# Patient Record
Sex: Male | Born: 1990 | Race: White | Hispanic: No | Marital: Single | Smoking: Current every day smoker
Health system: Southern US, Community
[De-identification: ages and names within clinical notes are randomized; demographics above are authoritative.]

## PROBLEM LIST (undated history)

## (undated) DIAGNOSIS — I10 Essential (primary) hypertension: Secondary | ICD-10-CM

## (undated) DIAGNOSIS — K219 Gastro-esophageal reflux disease without esophagitis: Secondary | ICD-10-CM

## (undated) DIAGNOSIS — F988 Other specified behavioral and emotional disorders with onset usually occurring in childhood and adolescence: Secondary | ICD-10-CM

## (undated) DIAGNOSIS — J45909 Unspecified asthma, uncomplicated: Secondary | ICD-10-CM

## (undated) HISTORY — DX: Other specified behavioral and emotional disorders with onset usually occurring in childhood and adolescence: F98.8

## (undated) HISTORY — DX: Gastro-esophageal reflux disease without esophagitis: K21.9

## (undated) HISTORY — DX: Essential (primary) hypertension: I10

---
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
## 2011-11-21 ENCOUNTER — Emergency Department (HOSPITAL_COMMUNITY)
Admission: EM | Admit: 2011-11-21 | Discharge: 2011-11-21 | Discharge status: Against Medical Advice | Payer: No Typology Code available for payment source | Attending: Emergency Medicine | Admitting: Emergency Medicine

## 2011-11-21 ENCOUNTER — Encounter (HOSPITAL_COMMUNITY): Payer: Self-pay | Admitting: Emergency Medicine

## 2011-11-21 DIAGNOSIS — R079 Chest pain, unspecified: Secondary | ICD-10-CM | POA: Insufficient documentation

## 2011-11-21 HISTORY — DX: Unspecified asthma, uncomplicated: J45.909

## 2011-11-21 NOTE — ED Notes (Signed)
Pt was found in the hallway outside of his room with pt's mother and father asking for the way out.  Pt stated he is refusing treatment and wanted to leave.  I asked if he wanted to sign to be discharged and pt stated, "No".

## 2011-11-21 NOTE — ED Notes (Signed)
Pt stated he needed to use the bathroom - Nurse provided urinal to pt and he asked for privacy.  When nurse entered the room - pt was found standing up from unfastening himself off of the long spine board attempting to use the urinal.  Pt screamed, "I can't piss laying down."  I instructed pt to lay back down and explained to him that he could cause further injury to himself by standing.  Pt refused and threw the urinal on the bed and attempted to close the door while nurse was standing in the doorway.  I told him not to shut the door and if he continued to be non-compliant, security would be called.
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
## 2014-11-22 ENCOUNTER — Emergency Department: Payer: Self-pay

## 2014-11-22 ENCOUNTER — Emergency Department
Admission: EM | Admit: 2014-11-22 | Discharge: 2014-11-22 | Disposition: A | Discharge status: Home / Self Care | Payer: Self-pay | Attending: Emergency Medicine | Admitting: Emergency Medicine

## 2014-11-22 DIAGNOSIS — Y9289 Other specified places as the place of occurrence of the external cause: Secondary | ICD-10-CM | POA: Insufficient documentation

## 2014-11-22 DIAGNOSIS — Y9389 Activity, other specified: Secondary | ICD-10-CM | POA: Insufficient documentation

## 2014-11-22 DIAGNOSIS — Z23 Encounter for immunization: Secondary | ICD-10-CM | POA: Insufficient documentation

## 2014-11-22 DIAGNOSIS — Z720 Tobacco use: Secondary | ICD-10-CM | POA: Insufficient documentation

## 2014-11-22 DIAGNOSIS — Y998 Other external cause status: Secondary | ICD-10-CM | POA: Insufficient documentation

## 2014-11-22 DIAGNOSIS — S91312A Laceration without foreign body, left foot, initial encounter: Secondary | ICD-10-CM | POA: Insufficient documentation

## 2014-11-22 DIAGNOSIS — IMO0002 Reserved for concepts with insufficient information to code with codable children: Secondary | ICD-10-CM

## 2014-11-22 DIAGNOSIS — W228XXA Striking against or struck by other objects, initial encounter: Secondary | ICD-10-CM | POA: Insufficient documentation

## 2014-11-22 MED ORDER — TETANUS-DIPHTH-ACELL PERTUSSIS 5-2.5-18.5 LF-MCG/0.5 IM SUSP
0.5000 mL | Freq: Once | INTRAMUSCULAR | Status: AC
  Administered 2014-11-22: 0.5 mL via INTRAMUSCULAR

## 2014-11-22 MED ORDER — TETANUS-DIPHTH-ACELL PERTUSSIS 5-2.5-18.5 LF-MCG/0.5 IM SUSP
INTRAMUSCULAR | Status: AC
  Filled 2014-11-22: qty 0.5

## 2014-11-22 MED ORDER — CEPHALEXIN 500 MG PO CAPS
500.0000 mg | ORAL_CAPSULE | Freq: Once | ORAL | Status: AC
  Administered 2014-11-22: 500 mg via ORAL

## 2014-11-22 MED ORDER — CEPHALEXIN 500 MG PO CAPS
ORAL_CAPSULE | ORAL | Status: AC
  Filled 2014-11-22: qty 1

## 2014-11-22 MED ORDER — CEPHALEXIN 500 MG PO CAPS: 500.0000 mg | ORAL_CAPSULE | Freq: Two times a day (BID) | ORAL | Status: DC

## 2014-11-22 MED ORDER — IBUPROFEN 800 MG PO TABS: 800.0000 mg | ORAL_TABLET | Freq: Three times a day (TID) | ORAL | Status: DC | PRN

## 2014-11-22 MED ORDER — HYDROCODONE-ACETAMINOPHEN 5-325 MG PO TABS
ORAL_TABLET | ORAL | Status: AC
  Filled 2014-11-22: qty 1

## 2014-11-22 MED ORDER — BACITRACIN ZINC 500 UNIT/GM EX OINT
TOPICAL_OINTMENT | CUTANEOUS | Status: AC
  Administered 2014-11-22: 23:00:00
  Filled 2014-11-22: qty 0.9

## 2014-11-22 MED ORDER — HYDROCODONE-ACETAMINOPHEN 5-325 MG PO TABS
1.0000 | ORAL_TABLET | Freq: Once | ORAL | Status: AC
  Administered 2014-11-22: 1 via ORAL

## 2014-11-22 NOTE — Discharge Instructions (Signed)
Clean the wound 2 times per day and apply antibiotic ointment. Use the crutches until pain decreases and there is no bleeding. Return to the ER for symptoms of concern if you are unable to see your primary care provider.
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
## 2015-05-23 ENCOUNTER — Encounter: Payer: Self-pay | Admitting: Emergency Medicine

## 2015-05-23 ENCOUNTER — Emergency Department
Admission: EM | Admit: 2015-05-23 | Discharge: 2015-05-23 | Disposition: A | Discharge status: Home / Self Care | Payer: Self-pay | Attending: Emergency Medicine | Admitting: Emergency Medicine

## 2015-05-23 DIAGNOSIS — F17210 Nicotine dependence, cigarettes, uncomplicated: Secondary | ICD-10-CM | POA: Insufficient documentation

## 2015-05-23 DIAGNOSIS — Z792 Long term (current) use of antibiotics: Secondary | ICD-10-CM | POA: Insufficient documentation

## 2015-05-23 DIAGNOSIS — Z202 Contact with and (suspected) exposure to infections with a predominantly sexual mode of transmission: Secondary | ICD-10-CM | POA: Insufficient documentation

## 2015-05-23 LAB — CHLAMYDIA/NGC RT PCR (ARMC ONLY)
Chlamydia Tr: DETECTED — AB
N gonorrhoeae: NOT DETECTED

## 2015-05-23 MED ORDER — AZITHROMYCIN 250 MG PO TABS
1000.0000 mg | ORAL_TABLET | Freq: Once | ORAL | Status: AC
  Administered 2015-05-23: 1000 mg via ORAL
  Filled 2015-05-23: qty 4

## 2015-05-23 MED ORDER — METRONIDAZOLE 500 MG PO TABS: 2000.0000 mg | ORAL_TABLET | Freq: Two times a day (BID) | ORAL | Status: AC

## 2015-05-23 MED ORDER — CEFTRIAXONE SODIUM 250 MG IJ SOLR
250.0000 mg | Freq: Once | INTRAMUSCULAR | Status: AC
  Administered 2015-05-23: 250 mg via INTRAMUSCULAR
  Filled 2015-05-23: qty 250

## 2015-05-23 MED ORDER — LIDOCAINE HCL (PF) 1 % IJ SOLN
5.0000 mL | Freq: Once | INTRAMUSCULAR | Status: AC
  Administered 2015-05-23: 0.9 mL

## 2015-05-23 MED ORDER — LIDOCAINE HCL (PF) 1 % IJ SOLN
INTRAMUSCULAR | Status: AC
  Administered 2015-05-23: 0.9 mL
  Filled 2015-05-23: qty 5

## 2015-05-23 NOTE — ED Provider Notes (Signed)
[facility] Emergency Department Provider Note ____________________________________________  Time seen: 2045  I have reviewed the triage vital signs and the nursing notes.  HISTORY  Chief Complaint  Exposure to STD  HPI Patrick Leach is a 24 y.o. male reports to the ED for STD testing and prophylactic treatment following exposure to an STD. He was informed by prescription for that she tested positive earlier today. He is reporting as directed for empiric treatment. He denies any fever, nausea, sweats, penile discharge or dysuria.His girlfriend who was seen in the ED earlier today and treated for PID is present in the room during the interview. She confirms some vaginal discharge and being treated for an STD but reports that the gonorrhea chlamydia culture was pending at the time of her discharge. She is unclear at this time what the results revealed.  Past Medical History  Diagnosis Date  . Asthma    There are no active problems to display for this patient.  History reviewed. No pertinent past surgical history.  Current Outpatient Rx  Name  Route  Sig  Dispense  Refill  . cephALEXin (KEFLEX) 500 MG capsule   Oral   Take 1 capsule (500 mg total) by mouth 2 (two) times daily.   20 capsule   0   . ibuprofen (ADVIL,MOTRIN) 800 MG tablet   Oral   Take 1 tablet (800 mg total) by mouth every 8 (eight) hours as needed.   30 tablet   0   . metroNIDAZOLE (FLAGYL) 500 MG tablet   Oral   Take 4 tablets (2,000 mg total) by mouth 2 (two) times daily.   14 tablet   0    Allergies Review of patient's allergies indicates no known allergies.  No family history on file.  Social History Social History  Substance Use Topics  . Smoking status: Current Every Day Smoker -- 0.50 packs/day  . Smokeless tobacco: None  . Alcohol Use: 2.4 oz/week    4 Glasses of wine per week   Review of Systems  Constitutional: Negative for fever. Eyes: Negative for visual  changes. ENT: Negative for sore throat. Cardiovascular: Negative for chest pain. Respiratory: Negative for shortness of breath. Gastrointestinal: Negative for abdominal pain, vomiting and diarrhea. Genitourinary: Negative for dysuria. Denies penile discharge Musculoskeletal: Negative for back pain. Skin: Negative for rash. Neurological: Negative for headaches, focal weakness or numbness. ____________________________________________  PHYSICAL EXAM:  VITAL SIGNS: ED Triage Vitals  Enc Vitals Group     BP 05/23/15 2037 138/78 mmHg     Pulse Rate 05/23/15 2037 81     Resp 05/23/15 2037 16     Temp 05/23/15 2037 98.1 F (36.7 C)     Temp Source 05/23/15 2037 Oral     SpO2 05/23/15 2037 98 %     Weight 05/23/15 2037 170 lb (77.111 kg)     Height 05/23/15 2037 5\' 9"  (1.753 m)     Head Cir --      Peak Flow --      Pain Score --      Pain Loc --      Pain Edu? --      Excl. in GC? --    Constitutional: Alert and oriented. Well appearing and in no distress. Head: Normocephalic and atraumatic.      Eyes: Conjunctivae are normal. PERRL. Normal extraocular movements      Ears: Canals clear. TMs intact bilaterally.   Nose: No congestion/rhinorrhea.   Mouth/Throat: Mucous membranes  are moist.   Neck: Supple. No thyromegaly. Hematological/Lymphatic/Immunological: No cervical lymphadenopathy. Cardiovascular: Normal rate, regular rhythm.  Respiratory: Normal respiratory effort. No wheezes/rales/rhonchi. Gastrointestinal: Soft and nontender. No distention. GU: deferred Musculoskeletal: Nontender with normal range of motion in all extremities.  Neurologic:  Normal gait without ataxia. Normal speech and language. No gross focal neurologic deficits are appreciated. Skin:  Skin is warm, dry and intact. No rash noted. Psychiatric: Mood and affect are normal. Patient exhibits appropriate insight and judgment. ____________________________________________   LABS (pertinent  positives/negatives) Labs Reviewed  CHLAMYDIA/NGC RT PCR ([facility] ONLY)  ___________________________________________  PROCEDURES  Rocephin 250 mg IM Azithromycin 1 g PO ____________________________________________  INITIAL IMPRESSION / ASSESSMENT AND PLAN / ED COURSE  Patient with STD exposure. His girlfriend. He presents today for prophylactic management. He is treated empirically for gonorrhea and chlamydia.  Discharged with a prescription for Flagyl for trichomoniasis prophylaxis. GC cultures pending at time of discharge. ____________________________________________  FINAL CLINICAL IMPRESSION(S) / ED DIAGNOSES  Final diagnoses:  Exposure to STD      Lissa Hoard, PA-C 05/23/15 2142  Emily Filbert, MD 05/23/15 2255

## 2015-05-23 NOTE — Discharge Instructions (Signed)
Sexually Transmitted Disease °A sexually transmitted disease (STD) is a disease or infection that may be passed (transmitted) from person to person, usually during sexual activity. This may happen by way of saliva, semen, blood, vaginal mucus, or urine. Common STDs include: °· Gonorrhea. °· Chlamydia. °· Syphilis. °· HIV and AIDS. °· Genital herpes. °· Hepatitis B and C. °· Trichomonas. °· Human papillomavirus (HPV). °· Pubic lice. °· Scabies. °· Mites. °· Bacterial vaginosis. °WHAT ARE CAUSES OF STDs? °An STD may be caused by bacteria, a virus, or parasites. STDs are often transmitted during sexual activity if one person is infected. However, they may also be transmitted through nonsexual means. STDs may be transmitted after:  °· Sexual intercourse with an infected person. °· Sharing sex toys with an infected person. °· Sharing needles with an infected person or using unclean piercing or tattoo needles. °· Having intimate contact with the genitals, mouth, or rectal areas of an infected person. °· Exposure to infected fluids during birth. °WHAT ARE THE SIGNS AND SYMPTOMS OF STDs? °Different STDs have different symptoms. Some people may not have any symptoms. If symptoms are present, they may include: °· Painful or bloody urination. °· Pain in the pelvis, abdomen, vagina, anus, throat, or eyes. °· A skin rash, itching, or irritation. °· Growths, ulcerations, blisters, or sores in the genital and anal areas. °· Abnormal vaginal discharge with or without bad odor. °· Penile discharge in men. °· Fever. °· Pain or bleeding during sexual intercourse. °· Swollen glands in the groin area. °· Yellow skin and eyes (jaundice). This is seen with hepatitis. °· Swollen testicles. °· Infertility. °· Sores and blisters in the mouth. °HOW ARE STDs DIAGNOSED? °To make a diagnosis, your health care provider may: °· Take a medical history. °· Perform a physical exam. °· Take a sample of any discharge to examine. °· Swab the throat,  cervix, opening to the penis, rectum, or vagina for testing. °· Test a sample of your first morning urine. °· Perform blood tests. °· Perform a Pap test, if this applies. °· Perform a colposcopy. °· Perform a laparoscopy. °HOW ARE STDs TREATED? °Treatment depends on the STD. Some STDs may be treated but not cured. °· Chlamydia, gonorrhea, trichomonas, and syphilis can be cured with antibiotic medicine. °· Genital herpes, hepatitis, and HIV can be treated, but not cured, with prescribed medicines. The medicines lessen symptoms. °· Genital warts from HPV can be treated with medicine or by freezing, burning (electrocautery), or surgery. Warts may come back. °· HPV cannot be cured with medicine or surgery. However, abnormal areas may be removed from the cervix, vagina, or vulva. °· If your diagnosis is confirmed, your recent sexual partners need treatment. This is true even if they are symptom-free or have a negative culture or evaluation. They should not have sex until their health care providers say it is okay. °· Your health care provider may test you for infection again 3 months after treatment. °HOW CAN I REDUCE MY RISK OF GETTING AN STD? °Take these steps to reduce your risk of getting an STD: °· Use latex condoms, dental dams, and water-soluble lubricants during sexual activity. Do not use petroleum jelly or oils. °· Avoid having multiple sex partners. °· Do not have sex with someone who has other sex partners °· Do not have sex with anyone you do not know or who is at high risk for an STD. °· Avoid risky sex practices that can break your skin. °· Do not have sex   if you have open sores on your mouth or skin.  Avoid drinking too much alcohol or taking illegal drugs. Alcohol and drugs can affect your judgment and put you in a vulnerable position.  Avoid engaging in oral and anal sex acts.  Get vaccinated for HPV and hepatitis. If you have not received these vaccines in the past, talk to your health care  provider about whether one or both might be right for you.  If you are at risk of being infected with HIV, it is recommended that you take a prescription medicine daily to prevent HIV infection. This is called pre-exposure prophylaxis (PrEP). You are considered at risk if:  You are a man who has sex with other men (MSM).  You are a heterosexual man or woman and are sexually active with more than one partner.  You take drugs by injection.  You are sexually active with a partner who has HIV.  Talk with your health care provider about whether you are at high risk of being infected with HIV. If you choose to begin PrEP, you should first be tested for HIV. You should then be tested every 3 months for as long as you are taking PrEP. WHAT SHOULD I DO IF I THINK I HAVE AN STD?  See your health care provider.  Tell your sexual partner(s). They should be tested and treated for any STDs.  Do not have sex until your health care provider says it is okay. WHEN SHOULD I GET IMMEDIATE MEDICAL CARE? Contact your health care provider right away if:   You have severe abdominal pain.  You are a man and notice swelling or pain in your testicles.  You are a woman and notice swelling or pain in your vagina.   This information is not intended to replace advice given to you by your health care provider. Make sure you discuss any questions you have with your health care provider.   Document Released: 08/19/2002 Document Revised: 06/19/2014 Document Reviewed: 12/17/2012 Elsevier Interactive Patient Education 2016 [employer]  You  Have been treated for gonorrhea and chlamydia in the ED. You should take the prescription med as directed for possible trichomoniasis. Avoid any sexual contact until all pills are finished by you and your girlfriend AND all symptoms are gone. Follow-up with the [facility] as needed for further testing and treatment.
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
## 2015-05-23 NOTE — ED Notes (Signed)
Discussed discharge instructions, prescriptions, and follow-up care with patient. No questions or concerns at this time. Pt stable at discharge.  

## 2015-05-25 ENCOUNTER — Telehealth: Payer: Self-pay | Admitting: Emergency Medicine

## 2015-05-25 NOTE — ED Notes (Signed)
Called to give results of std testing.  Pt says he saw results on line.

## 2015-06-10 ENCOUNTER — Emergency Department
Admission: EM | Admit: 2015-06-10 | Discharge: 2015-06-10 | Disposition: A | Discharge status: Home / Self Care | Payer: Self-pay | Attending: Emergency Medicine | Admitting: Emergency Medicine

## 2015-06-10 ENCOUNTER — Encounter: Payer: Self-pay | Admitting: Emergency Medicine

## 2015-06-10 DIAGNOSIS — Z0000 Encounter for general adult medical examination without abnormal findings: Secondary | ICD-10-CM | POA: Insufficient documentation

## 2015-06-10 DIAGNOSIS — Z792 Long term (current) use of antibiotics: Secondary | ICD-10-CM | POA: Insufficient documentation

## 2015-06-10 DIAGNOSIS — F17200 Nicotine dependence, unspecified, uncomplicated: Secondary | ICD-10-CM | POA: Insufficient documentation

## 2015-06-10 DIAGNOSIS — Z139 Encounter for screening, unspecified: Secondary | ICD-10-CM

## 2015-06-10 LAB — URINALYSIS COMPLETE WITH MICROSCOPIC (ARMC ONLY)
BACTERIA UA: NONE SEEN
Bilirubin Urine: NEGATIVE
GLUCOSE, UA: NEGATIVE mg/dL
Hgb urine dipstick: NEGATIVE
KETONES UR: NEGATIVE mg/dL
LEUKOCYTES UA: NEGATIVE
NITRITE: NEGATIVE
PROTEIN: NEGATIVE mg/dL
Specific Gravity, Urine: 1.02 (ref 1.005–1.030)
Squamous Epithelial / LPF: NONE SEEN
pH: 7 (ref 5.0–8.0)

## 2015-06-10 NOTE — Discharge Instructions (Signed)
Follow-up with the health department if any continued problems.

## 2015-06-10 NOTE — ED Provider Notes (Signed)
[facility] Emergency Department Provider Note  ____________________________________________  Time seen: Approximately 10:47 AM  I have reviewed the triage vital signs and the nursing notes.   HISTORY  Chief Complaint Exposure to STD   HPI Patrick Leach is a 24 y.o. male patient is here to be checked for an STD. Patient states that he was called by Viviano Simas about his test results but patient states that he saw his results online. He is aware that he tested positive for chlamydia and is here to be tested. Patient states that his girlfriend was also in the emergency room and was seen for vaginal discharge. This patient was treated for gonorrhea and chlamydia when he was seen on 12/11 and given a prescription for Flagyl to treat for Trichomonas. Today he denies any symptoms or discharge. He denies any urinary symptoms, fever or chills, nausea or vomiting.He states that he did not understand that the medication he received while in the emergency room was for both gonorrhea and chlamydia.   Past Medical History  Diagnosis Date  . Asthma     There are no active problems to display for this patient.   History reviewed. No pertinent past surgical history.  Current Outpatient Rx  Name  Route  Sig  Dispense  Refill  . cephALEXin (KEFLEX) 500 MG capsule   Oral   Take 1 capsule (500 mg total) by mouth 2 (two) times daily.   20 capsule   0   . ibuprofen (ADVIL,MOTRIN) 800 MG tablet   Oral   Take 1 tablet (800 mg total) by mouth every 8 (eight) hours as needed.   30 tablet   0     Allergies Review of patient's allergies indicates no known allergies.  No family history on file.  Social History Social History  Substance Use Topics  . Smoking status: Current Every Day Smoker -- 0.50 packs/day  . Smokeless tobacco: None  . Alcohol Use: 2.4 oz/week    4 Glasses of wine per week    Review of Systems Constitutional: No fever/chills Cardiovascular:  Denies chest pain. Respiratory: Denies shortness of breath. Gastrointestinal: No abdominal pain.  No nausea, no vomiting.   Genitourinary: Negative for dysuria. Musculoskeletal: Negative for back pain. Skin: Negative for rash. Neurological: Negative for headaches, focal weakness or numbness.  10-point ROS otherwise negative.  ____________________________________________   PHYSICAL EXAM:  VITAL SIGNS: ED Triage Vitals  Enc Vitals Group     BP 06/10/15 1035 144/67 mmHg     Pulse Rate 06/10/15 1035 63     Resp --      Temp 06/10/15 1035 97.6 F (36.4 C)     Temp Source 06/10/15 1035 Oral     SpO2 06/10/15 1035 98 %     Weight 06/10/15 1035 170 lb (77.111 kg)     Height 06/10/15 1035  (1.727 m)     Head Cir --      Peak Flow --      Pain Score 06/10/15 1035 0     Pain Loc --      Pain Edu? --      Excl. in GC? --     Constitutional: Alert and oriented. Well appearing and in no acute distress. Eyes: Conjunctivae are normal. PERRL. EOMI. Head: Atraumatic. Nose: No congestion/rhinnorhea. Neck: No stridor.   Cardiovascular: Normal rate, regular rhythm. Grossly normal heart sounds.  Good peripheral circulation. Respiratory: Normal respiratory effort.  No retractions. Lungs CTAB. Gastrointestinal: Soft and  nontender. No distention.  Musculoskeletal: Moves upper and lower extremities without any difficulty. Normal gait was noted while in the emergency room. Neurologic:  Normal speech and language. No gross focal neurologic deficits are appreciated. No gait instability. Skin:  Skin is warm, dry and intact. No rash noted. Psychiatric: Mood and affect are normal. Speech and behavior are normal.  ____________________________________________   LABS (all labs ordered are listed, but only abnormal results are displayed)  Labs Reviewed  URINALYSIS COMPLETEWITH MICROSCOPIC ([facility] ONLY) - Abnormal; Notable for the following:    Color, Urine YELLOW (*)    APPearance CLEAR (*)     All other components within normal limits    PROCEDURES  Procedure(s) performed: None  Critical Care performed: No  ____________________________________________   INITIAL IMPRESSION / ASSESSMENT AND PLAN / ED COURSE  Pertinent labs & imaging results that were available during my care of the patient were reviewed by me and considered in my medical decision making (see chart for details).  Urinalysis was clear and patient was informed of the results. We discussed that he has only been treated for chlamydia. He is to [facility] the [facility] if any continued problems or concerns. ____________________________________________   FINAL CLINICAL IMPRESSION(S) / ED DIAGNOSES  Final diagnoses:  Encounter for medical screening examination      Tommi RumpsRhonda L Jaymison Luber, PA-C 06/10/15 1234  Sharman CheekPhillip Stafford, MD 06/10/15 1549

## 2015-06-10 NOTE — ED Notes (Signed)
States he thinks he has a STD..Marland Kitchen

## 2016-09-03 IMAGING — DX DG OS CALCIS 2+V*L*
2 series · 2 of 2 positions shown · non-contrast
Comparison: None.

CLINICAL DATA: Hit left foot on object while floating down river.
Multiple lacerations to the bottom of the left heel. Initial
encounter.

EXAM:
LEFT OS CALCIS - 2+ VIEW

[calcaneus axial]
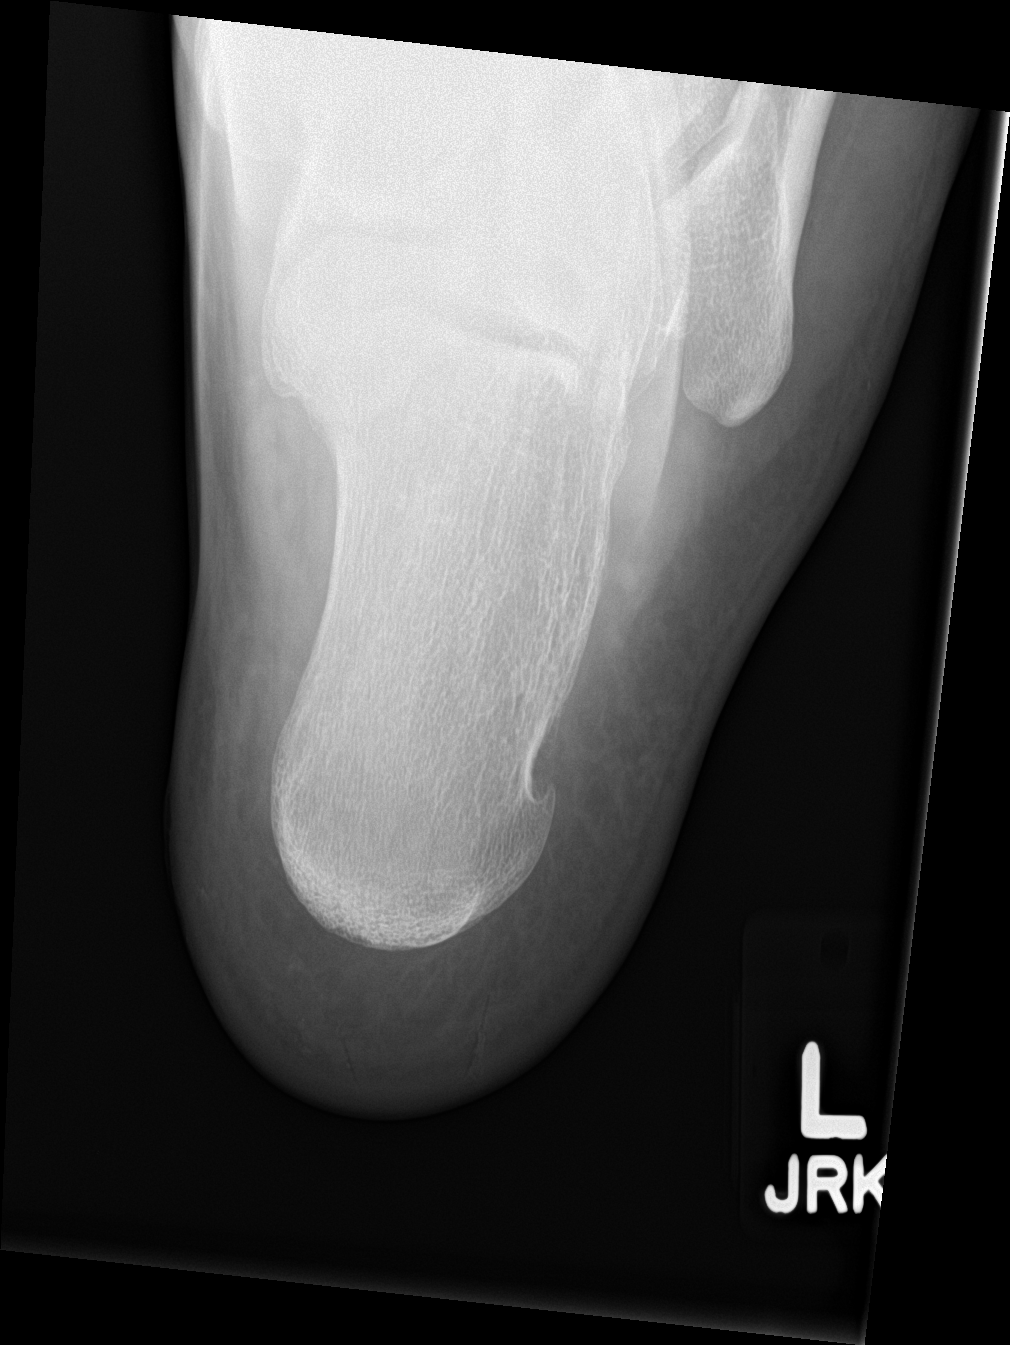

[calcaneus lat]
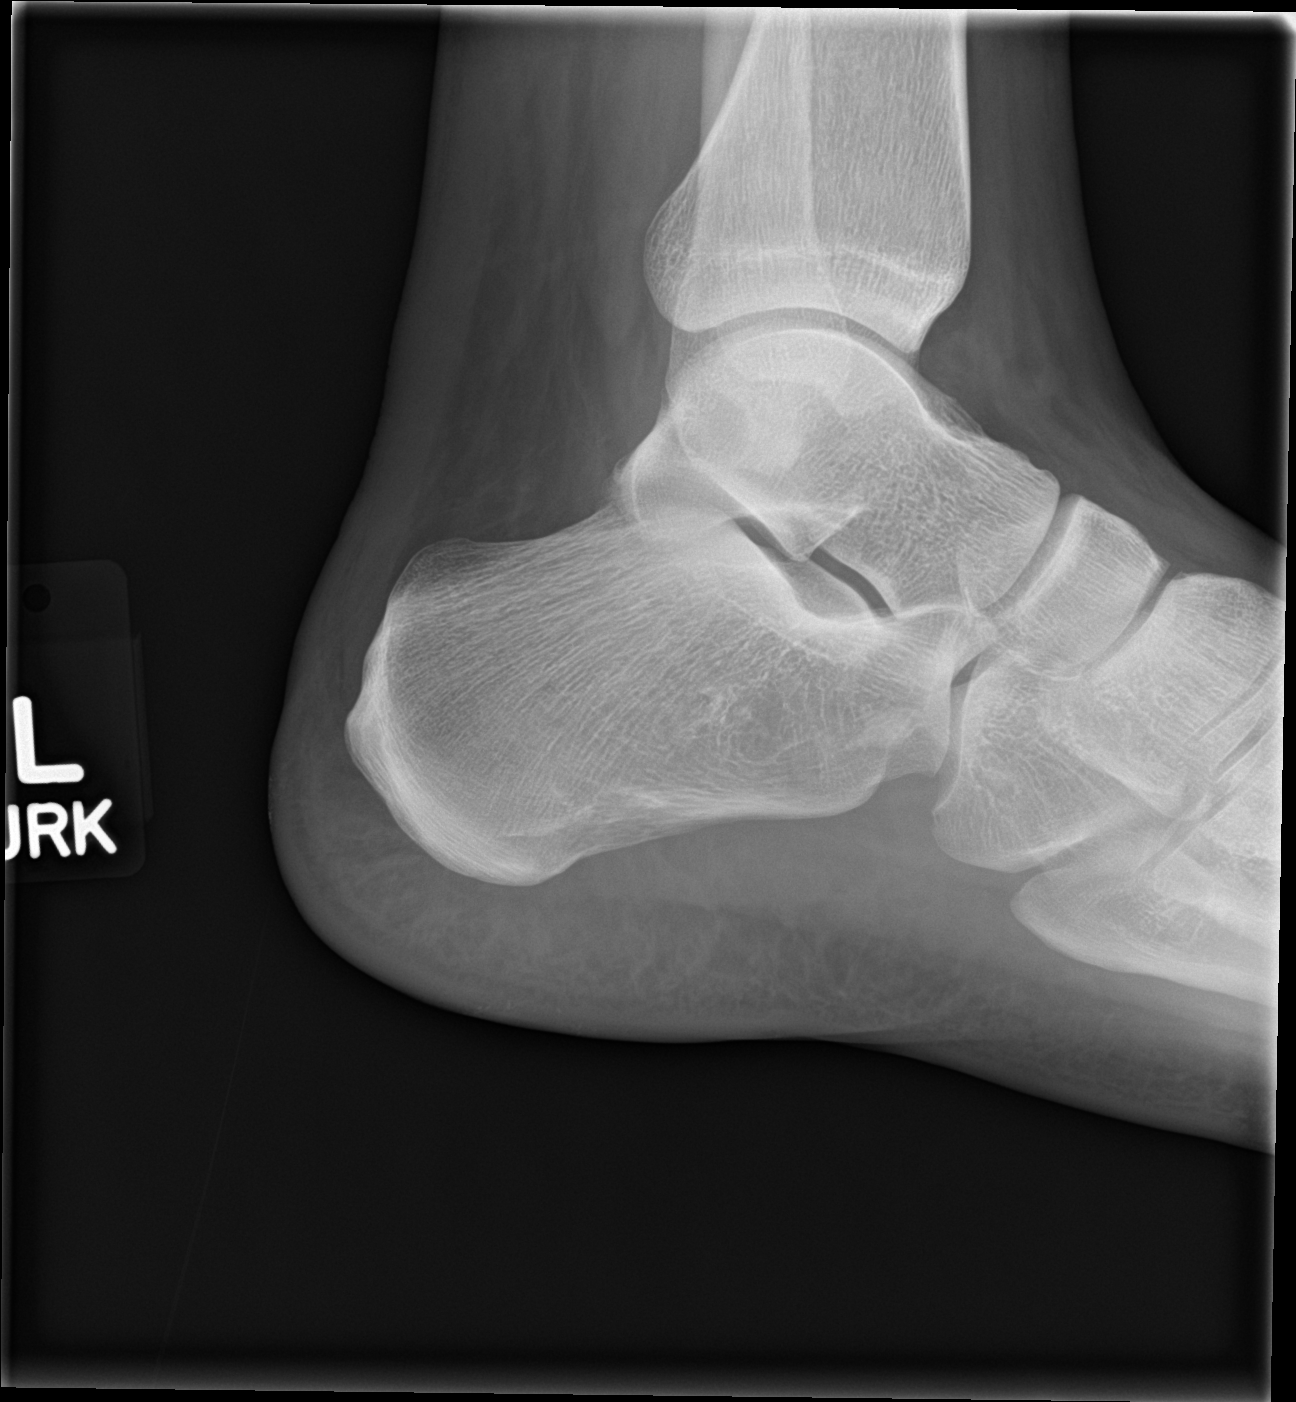

[2 of 2 positions shown; findings below may reference images not displayed]

FINDINGS: There is question of minimal debris along the soft tissue
lacerations at the left heel, though this could also simply reflect
soft tissue calcifications. There is no evidence of osseous
disruption. The calcaneus is unremarkable in appearance. The
subtalar joint is within normal limits.
IMPRESSION: Question of minimal debris along the soft tissue lacerations of the
left heel, though this could also simply reflect soft tissue
calcifications. No evidence of osseous disruption.

## 2020-10-28 ENCOUNTER — Encounter: Payer: Self-pay | Admitting: Neurology

## 2020-10-28 ENCOUNTER — Institutional Professional Consult (permissible substitution): Payer: BC Managed Care – PPO | Admitting: Neurology

## 2022-06-26 ENCOUNTER — Encounter: Payer: Self-pay | Admitting: *Deleted

## 2022-06-27 ENCOUNTER — Encounter: Payer: Self-pay | Admitting: Neurology

## 2022-06-27 ENCOUNTER — Ambulatory Visit (INDEPENDENT_AMBULATORY_CARE_PROVIDER_SITE_OTHER): Payer: 59 | Admitting: Neurology

## 2022-06-27 VITALS — BP 133/87 | HR 72 | Ht 70.0 in | Wt 209.0 lb

## 2022-06-27 DIAGNOSIS — G4750 Parasomnia, unspecified: Secondary | ICD-10-CM | POA: Diagnosis not present

## 2022-06-27 DIAGNOSIS — F513 Sleepwalking [somnambulism]: Secondary | ICD-10-CM | POA: Diagnosis not present

## 2022-06-27 DIAGNOSIS — G478 Other sleep disorders: Secondary | ICD-10-CM

## 2022-06-27 DIAGNOSIS — R0681 Apnea, not elsewhere classified: Secondary | ICD-10-CM

## 2022-06-27 DIAGNOSIS — E663 Overweight: Secondary | ICD-10-CM

## 2022-06-27 DIAGNOSIS — R0683 Snoring: Secondary | ICD-10-CM | POA: Diagnosis not present

## 2022-06-27 DIAGNOSIS — G4730 Sleep apnea, unspecified: Secondary | ICD-10-CM

## 2022-06-27 NOTE — Progress Notes (Signed)
Subjective:    Patient ID: Patrick Leach is a 32 y.o. male.  HPI    Star Age, MD, PhD [facility] [address], [address] [place], [place] [address]  Dear Seth Bake,  I saw your patient, Patrick Leach, upon your kind request, in my sleep clinic today for initial consultation of his sleep disturbances, including snoring, acting out dreams and moving his limbs at night, having violent outburst and unwanted sexual behaviors during sleep.  The patient is unaccompanied today.  As you know, Patrick Leach is a 32 year old male with an underlying medical history of asthma, reflux disease, hypertension, ADD, and mild obesity, who reports a longstanding history since childhood of sleep walking and sleep talking.  For the past 4 years he has had unwanted behaviors during sleep including eating while asleep without any recollection afterwards, having violent outburst that are at times directed against his wife, having had unwanted sexual behaviors that have been directed against his wife, and talking in his sleep about other male partners. He has a longer standing history of snoring and witnessed apneas per wife's feedback.  He reports that he has no recollection of his unwanted behaviors, they may last several minutes as far as he knows and have occurred regularly, unclear what frequency.  He reports that his wife "kicked (him) out" some 4 months ago.  He has very little dreams, at least did not recall dreaming vividly, does not recall fighting in his dreams or chasing anything.  He has made changes to his lifestyle to see if his sleep would improve.  He used to smoke marijuana but stopped it about 2 years ago.  He used to drink alcohol but stopped it about 2 years ago.  He was able to quit smoking cigarettes as well but picked it up again about 4 months ago and smokes about half a pack per day.  He drinks caffeine in the form of energy drink, has 1 a day.  He has been on Adderall  but this has been about 6 to 7 months.  He was given a prescription for prazosin in November 2023 but has not actually picked it up and has not tried it without reason.  He thought it was his acid reflux medication and did not realize it was a new medication.  He does endorse depression, he has not been on antidepressant medication.  He denies any significant anxiety.  He has not seen a psychologist or psychiatrist.  He feels fairly well rested first thing in the morning.  He denies night to night nocturia or recurrent nighttime or morning headaches.  He has never had a sleep study. His Epworth sleepiness score is 4 out of 24, fatigue severity score is 20 out of 63.  I reviewed your office note from 09/01/2021, as well as 01/10/2022.  He has been on Adderall for his ADD, 20 mg once daily.  He takes losartan for hypertension.  He has been on Wellbutrin for smoking cessation.  He has previously been on phentermine for weight loss. Of note, he was previously referred to our office in 2022 for snoring and excessive movements while asleep.  He did not make it to his appointment in May 2022.  He works as a Administrator.  He is currently staying by himself and has the kids on the weekends, they are ages 63, 5 and 31 years old.  Of note, he reports that he has not had any unwanted sexual behaviors with  his wife when one of the kids is in the bed with them.  Through his work, he reports that he was able to take a home sleep test but has not done so.  His Past Medical History Is Significant For: Past Medical History:  Diagnosis Date   ADD (attention deficit disorder)    Asthma    GERD (gastroesophageal reflux disease)    Hypertension     His Past Surgical History Is Significant For: History reviewed. No pertinent surgical history.  His Family History Is Significant For: Family History  Problem Relation Age of Onset   Hypertension Mother    Heart disease Mother     His Social History Is Significant For: Social  History   Socioeconomic History   Marital status: Single    Spouse name: Not on file   Number of children: Not on file   Years of education: Not on file   Highest education level: Not on file  Occupational History   Not on file  Tobacco Use   Smoking status: Every Day    Packs/day: 0.50    Types: Cigarettes   Smokeless tobacco: Never  Vaping Use   Vaping Use: Every day  Substance and Sexual Activity   Alcohol use: Not Currently    Comment: 2021 stopped   Drug use: Yes    Types: Marijuana    Comment: stopped 2021   Sexual activity: Not on file  Other Topics Concern   Not on file  Social History Narrative   Not on file   Social Determinants of Health   Financial Resource Strain: Not on file  Food Insecurity: Not on file  Transportation Needs: Not on file  Physical Activity: Not on file  Stress: Not on file  Social Connections: Not on file    His Allergies Are:  No Known Allergies:   His Current Medications Are:  Outpatient Encounter Medications as of 06/27/2022  Medication Sig   amphetamine-dextroamphetamine (ADDERALL) 20 MG tablet Take 20 mg by mouth daily.   prazosin (MINIPRESS) 1 MG capsule Take 1 mg by mouth at bedtime.   losartan (COZAAR) 25 MG tablet Take 25 mg by mouth daily. (Patient not taking: Reported on 06/27/2022)   No facility-administered encounter medications on file as of 06/27/2022.  :   Review of Systems:  Out of a complete 14 point review of systems, all are reviewed and negative with the exception of these symptoms as listed below:  Review of Systems  Neurological:        Never a sleep study.  Snoring, acts out, is violent when acts out. ESS 4, FSS 20.     Objective:  Neurological Exam  Physical Exam Physical Examination:   Vitals:   06/27/22 0912  BP: 133/87  Pulse: 72    General Examination: The patient is a very pleasant 32 y.o. male in no acute distress. He appears well-developed and well-nourished and well groomed.    HEENT: Normocephalic, atraumatic, pupils are equal, round and reactive to light, extraocular tracking is good without limitation to gaze excursion or nystagmus noted. Hearing is grossly intact. Face is symmetric with normal facial animation. Speech is clear with no dysarthria noted. There is no hypophonia. There is no lip, neck/head, jaw or voice tremor. Neck is supple with full range of passive and active motion. There are no carotid bruits on auscultation. Oropharynx exam reveals: mild mouth dryness, adequate dental hygiene and moderate airway crowding, due to small airway entry, wider uvula, tonsillar size  of about 1-2+.  Mallampati class III.  Neck circumference 16 5/8 inches.  Minimal overbite noted.  Tongue protrudes centrally and palate elevates symmetrically.  Chest: Clear to auscultation without wheezing, rhonchi or crackles noted.  Heart: S1+S2+0, regular and normal without murmurs, rubs or gallops noted.   Abdomen: Soft, non-tender and non-distended.  Extremities: There is no pitting edema in the distal lower extremities bilaterally.   Skin: Warm and dry without trophic changes noted.   Musculoskeletal: exam reveals no obvious joint deformities.   Neurologically:  Mental status: The patient is awake, alert and oriented in all 4 spheres. His immediate and remote memory, attention, language skills and fund of knowledge are appropriate. There is no evidence of aphasia, agnosia, apraxia or anomia. Speech is clear with normal prosody and enunciation. Thought process is linear. Mood is normal and affect is normal.  Cranial nerves II - XII are as described above under HEENT exam.  Motor exam: Normal bulk, strength and tone is noted. There is no obvious action or resting tremor.  Fine motor skills and coordination: grossly intact.  Cerebellar testing: No dysmetria or intention tremor. There is no truncal or gait ataxia.  Sensory exam: intact to light touch in the upper and lower  extremities.  Gait, station and balance: He stands easily. No veering to one side is noted. No leaning to one side is noted. Posture is age-appropriate and stance is narrow based. Gait shows normal stride length and normal pace. No problems turning are noted.   Assessment and Plan:  In summary, Patrick Leach is a very pleasant 32 y.o.-year old male with an underlying medical history of asthma, reflux disease, hypertension, ADD, and mild obesity, who presents for evaluation of his sleep disorder of several years duration, concern for underlying obstructive sleep apnea and parasomnias including a longstanding history since childhood of sleepwalking and sleep talking as well as other parasomnias, likely during non-REM sleep, does not have any telltale features of REM behavior disorder.  He denies any nightmares.  Has a send for prazosin but has actually not tried it.  I had a long discussion with the patient today.  I am not sure as to how to classify his parasomnias, he is advised that should he have an organic sleep disorder, particularly obstructive sleep apnea, it has an increased risk of associated parasomnias and even involuntary movements in sleep.  Nevertheless, it may be difficult to tease out if there is an underlying psychological cause.  He does endorse depression which should be addressed.  He is encouraged to make a follow-up appointment with your office to address depression, the possibility of seeing a psychologist and I would recommend evaluation with a psychologist, and/or a psychiatrist if need be.  He may benefit from therapy.  He is advised to talk to you again about the trial of prazosin.  He may want to try it for about a month and make a follow-up appointment with your office afterwards.  He is encouraged to pursue sleep testing through our office.  A laboratory attended sleep study would be more informative than a home sleep test.   I had a long chat with the patient about my findings and  the diagnosis of sleep apnea, particularly OSA, its prognosis and treatment options. We talked about medical/conservative treatments, surgical interventions and non-pharmacological approaches for symptom control. I explained, in particular, the risks and ramifications of untreated moderate to severe OSA, especially with respect to developing cardiovascular disease down the road, including  congestive heart failure (CHF), difficult to treat hypertension, cardiac arrhythmias (particularly A-fib), neurovascular complications including TIA, stroke and dementia. Even type 2 diabetes has, in part, been linked to untreated OSA. Symptoms of untreated OSA may include (but may not be limited to) daytime sleepiness, nocturia (i.e. frequent nighttime urination), memory problems, mood irritability and suboptimally controlled or worsening mood disorder such as depression and/or anxiety, lack of energy, lack of motivation, physical discomfort, as well as recurrent headaches, especially morning or nocturnal headaches. We talked about the importance of maintaining a healthy lifestyle and striving for healthy weight.  The importance of complete smoking cessation was also addressed.  In addition, we talked about the importance of striving for and maintaining good sleep hygiene.  In particular, he is advised not to watch TV while in bed and in the bedroom.  He currently has a TV on at night.  I recommended a sleep study at this time. I outlined the differences between a laboratory attended sleep study which is considered more comprehensive and accurate over the option of a home sleep test (HST); the latter may lead to underestimation of sleep disordered breathing in some instances and does not help with diagnosing upper airway resistance syndrome and is not accurate enough to diagnose primary central sleep apnea typically. I outlined possible surgical and non-surgical treatment options of OSA, including the use of a positive airway  pressure (PAP) device (i.e. CPAP, AutoPAP/APAP or BiPAP in certain circumstances), a custom-made dental device (aka oral appliance, which would require a referral to a specialist dentist or orthodontist typically, and is generally speaking not considered for patients with full dentures or edentulous state), upper airway surgical options, such as traditional UPPP (which is not considered a first-line treatment) or the Inspire device (hypoglossal nerve stimulator, which would involve a referral for consultation with an ENT surgeon, after careful selection, following inclusion criteria - also not first-line treatment). I explained the PAP treatment option to the patient in detail, as this is generally considered first-line treatment.  The patient indicated that he would be willing to try PAP therapy, if the need arises. I explained the importance of being compliant with PAP treatment, not only for insurance purposes but primarily to improve patient's symptoms symptoms, and for the patient's long term health benefit, including to reduce His cardiovascular risks longer-term.    We will pick up our discussion about the next steps and treatment options after testing.  We will keep him posted as to the test results by phone call and/or MyChart messaging where possible.  We will plan to follow-up in sleep clinic accordingly as well.  I answered all his questions today and the patient was in agreement.   I encouraged him to call with any interim questions, concerns, problems or updates or email [place] through [place].  Generally speaking, sleep test authorizations may take up to 2 weeks, sometimes less, sometimes longer, the patient is encouraged to get in touch with [place] if they do not hear back from the sleep lab staff directly within the next 2 weeks.  Thank you very much for allowing me to participate in the care of this nice patient. If I can be of any further assistance to you please do not hesitate to call me at  [phone].  Sincerely,   Star Age, MD, PhD

## 2022-06-27 NOTE — Patient Instructions (Signed)

## 2022-07-18 NOTE — Telephone Encounter (Signed)
[employer]- [employer] Josem Kaufmann: [id number] (exp. 07/23/22 to 09/10/22)   Patient is scheduled at [facility] for 07/24/22 at 9 pm.  Is is aware to eat his evening meal before arriving, to bring a snack is he would like, bring comfortable clothes to sleep in and to bring any medications he may take before bed.

## 2022-07-24 NOTE — Telephone Encounter (Signed)
I spoke with the patient to r/s his SS that was scheduled for 07/24/22 due to tech out sick.  The patient is r/s for 09/18/22 at 8 pm.  I did inform him I put him on the cancellation list and will give him a call if I have a cancellation.   I will have to redo the auth once the appt gets closer.

## 2022-07-25 NOTE — Telephone Encounter (Signed)
I spoke with the patient and informed him I had a cancellation.Marland Kitchen  He is now scheduled for 08/08/22 at 8 pm.  Mailed new packet to the patient.

## 2022-08-08 ENCOUNTER — Ambulatory Visit (INDEPENDENT_AMBULATORY_CARE_PROVIDER_SITE_OTHER): Payer: 59 | Admitting: Neurology

## 2022-08-08 DIAGNOSIS — E663 Overweight: Secondary | ICD-10-CM

## 2022-08-08 DIAGNOSIS — R0683 Snoring: Secondary | ICD-10-CM

## 2022-08-08 DIAGNOSIS — G4720 Circadian rhythm sleep disorder, unspecified type: Secondary | ICD-10-CM

## 2022-08-08 DIAGNOSIS — F513 Sleepwalking [somnambulism]: Secondary | ICD-10-CM | POA: Diagnosis not present

## 2022-08-08 DIAGNOSIS — G4730 Sleep apnea, unspecified: Secondary | ICD-10-CM

## 2022-08-08 DIAGNOSIS — G4750 Parasomnia, unspecified: Secondary | ICD-10-CM

## 2022-08-08 DIAGNOSIS — G4763 Sleep related bruxism: Secondary | ICD-10-CM

## 2022-08-08 DIAGNOSIS — G478 Other sleep disorders: Secondary | ICD-10-CM

## 2022-08-08 DIAGNOSIS — R0681 Apnea, not elsewhere classified: Secondary | ICD-10-CM

## 2022-08-14 NOTE — Procedures (Signed)
Physician Interpretation: See scanned report.  Technical Report: See scanned report.

## 2022-08-21 ENCOUNTER — Telehealth: Payer: Self-pay | Admitting: Neurology

## 2022-08-21 NOTE — Telephone Encounter (Signed)
Please call patient and advise him that his sleep study did not show any evidence of obstructive sleep apnea, he does not need to be treated with a CPAP or similar machine. He had mostly light stage sleep, little dream sleep, which is a nonspecific finding.  There was no evidence of abnormal vocalization or sleep talking or unusual behaviors.  He did have intermittent teeth grinding, if he has disturbing teeth grinding, he can talk to his dentist about a bite guard.  At this juncture, he can follow-up with his primary care and other providers.

## 2022-08-21 NOTE — Telephone Encounter (Signed)
Pt called stated he is following-up on sleep study results.

## 2022-08-24 ENCOUNTER — Encounter: Payer: Self-pay | Admitting: Neurology

## 2022-08-24 NOTE — Telephone Encounter (Signed)
Error

## 2022-08-24 NOTE — Telephone Encounter (Signed)
Pt has questions about sleep study results. Would like a call from the nurse.  Informed pt nurse route call to Dr. Rexene Alberts this morning and waiting on her response.
